# Patient Record
Sex: Female | Born: 1935 | Race: White | Hispanic: No | Marital: Married | Smoking: Former smoker
Health system: Southern US, Community
[De-identification: ages and names within clinical notes are randomized; demographics above are authoritative.]

## PROBLEM LIST (undated history)

## (undated) DIAGNOSIS — J449 Chronic obstructive pulmonary disease, unspecified: Secondary | ICD-10-CM

## (undated) HISTORY — PX: BACK SURGERY: SHX140

## (undated) HISTORY — PX: ABDOMINAL HYSTERECTOMY: SHX81

---

## 2003-12-26 ENCOUNTER — Ambulatory Visit: Payer: Self-pay | Admitting: Unknown Physician Specialty

## 2004-01-16 ENCOUNTER — Encounter: Payer: Self-pay | Admitting: Orthopedic Surgery

## 2004-01-18 ENCOUNTER — Encounter: Payer: Self-pay | Admitting: Orthopedic Surgery

## 2004-02-18 ENCOUNTER — Encounter: Payer: Self-pay | Admitting: Orthopedic Surgery

## 2004-04-17 ENCOUNTER — Ambulatory Visit: Payer: Self-pay | Admitting: Pain Medicine

## 2004-04-17 ENCOUNTER — Ambulatory Visit: Payer: Self-pay | Admitting: Gynecology

## 2004-04-22 ENCOUNTER — Ambulatory Visit: Payer: Self-pay | Admitting: Pain Medicine

## 2004-05-30 ENCOUNTER — Ambulatory Visit: Payer: Self-pay | Admitting: Pain Medicine

## 2004-06-10 ENCOUNTER — Ambulatory Visit: Payer: Self-pay | Admitting: Pain Medicine

## 2005-02-28 ENCOUNTER — Ambulatory Visit: Payer: Self-pay | Admitting: Internal Medicine

## 2005-07-10 ENCOUNTER — Ambulatory Visit: Payer: Self-pay | Admitting: Internal Medicine

## 2006-08-03 ENCOUNTER — Ambulatory Visit: Payer: Self-pay | Admitting: Internal Medicine

## 2006-10-14 ENCOUNTER — Ambulatory Visit: Payer: Self-pay | Admitting: Pain Medicine

## 2007-09-13 ENCOUNTER — Ambulatory Visit: Payer: Self-pay | Admitting: Internal Medicine

## 2008-08-05 ENCOUNTER — Ambulatory Visit: Payer: Self-pay | Admitting: Internal Medicine

## 2008-10-31 ENCOUNTER — Ambulatory Visit: Payer: Self-pay | Admitting: Internal Medicine

## 2009-10-04 ENCOUNTER — Ambulatory Visit: Payer: Self-pay | Admitting: Pain Medicine

## 2009-10-10 ENCOUNTER — Ambulatory Visit: Payer: Self-pay | Admitting: Pain Medicine

## 2009-11-01 ENCOUNTER — Ambulatory Visit: Payer: Self-pay | Admitting: Internal Medicine

## 2009-11-13 ENCOUNTER — Ambulatory Visit: Payer: Self-pay | Admitting: Pain Medicine

## 2010-07-16 ENCOUNTER — Ambulatory Visit: Payer: Self-pay | Admitting: Family Medicine

## 2010-07-23 ENCOUNTER — Ambulatory Visit: Payer: Self-pay | Admitting: Pain Medicine

## 2010-07-24 ENCOUNTER — Ambulatory Visit: Payer: Self-pay | Admitting: Pain Medicine

## 2010-08-20 ENCOUNTER — Ambulatory Visit: Payer: Self-pay | Admitting: Pain Medicine

## 2010-08-26 ENCOUNTER — Ambulatory Visit: Payer: Self-pay | Admitting: Pain Medicine

## 2010-09-23 ENCOUNTER — Ambulatory Visit: Payer: Self-pay | Admitting: Pain Medicine

## 2010-09-30 ENCOUNTER — Ambulatory Visit: Payer: Self-pay | Admitting: Pain Medicine

## 2010-11-05 ENCOUNTER — Ambulatory Visit: Payer: Self-pay | Admitting: Internal Medicine

## 2010-11-18 ENCOUNTER — Ambulatory Visit: Payer: Self-pay | Admitting: Pain Medicine

## 2010-12-08 ENCOUNTER — Emergency Department: Payer: Self-pay | Admitting: Unknown Physician Specialty

## 2011-05-24 ENCOUNTER — Inpatient Hospital Stay: Payer: Self-pay | Admitting: Internal Medicine

## 2011-05-24 LAB — CBC
HCT: 40.5 % (ref 35.0–47.0)
MCH: 32.6 pg (ref 26.0–34.0)
MCV: 97 fL (ref 80–100)
RBC: 4.16 10*6/uL (ref 3.80–5.20)
RDW: 14.6 % — ABNORMAL HIGH (ref 11.5–14.5)
WBC: 7.5 10*3/uL (ref 3.6–11.0)

## 2011-05-24 LAB — TROPONIN I: Troponin-I: <0.02 ng/mL

## 2011-05-24 LAB — BASIC METABOLIC PANEL
BUN: 12 mg/dL (ref 7–18)
Calcium, Total: 9.4 mg/dL (ref 8.5–10.1)
Chloride: 102 mmol/L (ref 98–107)
Co2: 28 mmol/L (ref 21–32)
Creatinine: 0.84 mg/dL (ref 0.60–1.30)
Glucose: 109 mg/dL — ABNORMAL HIGH (ref 65–99)
Osmolality: 278 (ref 275–301)
Potassium: 3.6 mmol/L (ref 3.5–5.1)

## 2011-05-24 LAB — CK TOTAL AND CKMB (NOT AT ARMC): CK, Total: 165 U/L (ref 21–215)

## 2011-05-26 LAB — BASIC METABOLIC PANEL
Anion Gap: 11 (ref 7–16)
Calcium, Total: 8.8 mg/dL (ref 8.5–10.1)
Co2: 27 mmol/L (ref 21–32)
EGFR (African American): 54 — ABNORMAL LOW
EGFR (Non-African Amer.): 45 — ABNORMAL LOW
Glucose: 167 mg/dL — ABNORMAL HIGH (ref 65–99)
Potassium: 3.2 mmol/L — ABNORMAL LOW (ref 3.5–5.1)
Sodium: 139 mmol/L (ref 136–145)

## 2011-05-26 LAB — CBC WITH DIFFERENTIAL/PLATELET
Eosinophil %: 0 %
Lymphocyte #: 1 10*3/uL (ref 1.0–3.6)
Lymphocyte %: 5.2 %
MCHC: 33.2 g/dL (ref 32.0–36.0)
Monocyte #: 0.6 10*3/uL (ref 0.0–0.7)
Monocyte %: 2.9 %
Neutrophil %: 91.9 %
Platelet: 263 10*3/uL (ref 150–440)
RBC: 3.86 10*6/uL (ref 3.80–5.20)
RDW: 14.6 % — ABNORMAL HIGH (ref 11.5–14.5)
WBC: 20.1 10*3/uL — ABNORMAL HIGH (ref 3.6–11.0)

## 2011-07-03 ENCOUNTER — Institutional Professional Consult (permissible substitution): Payer: Self-pay | Admitting: Pulmonary Disease

## 2011-12-25 ENCOUNTER — Ambulatory Visit: Payer: Self-pay | Admitting: Internal Medicine

## 2013-05-21 IMAGING — CR DG CHEST 1V PORT
1 series · 1 of 1 positions shown · non-contrast
Comparison: none

REASON FOR EXAM: shortness of breath  rm 1
COMMENTS:

PROCEDURE:     DXR - DXR PORTABLE CHEST SINGLE VIEW  - May 24, 2011  [DATE]
RESULT:     Comparison: None.

[portable]
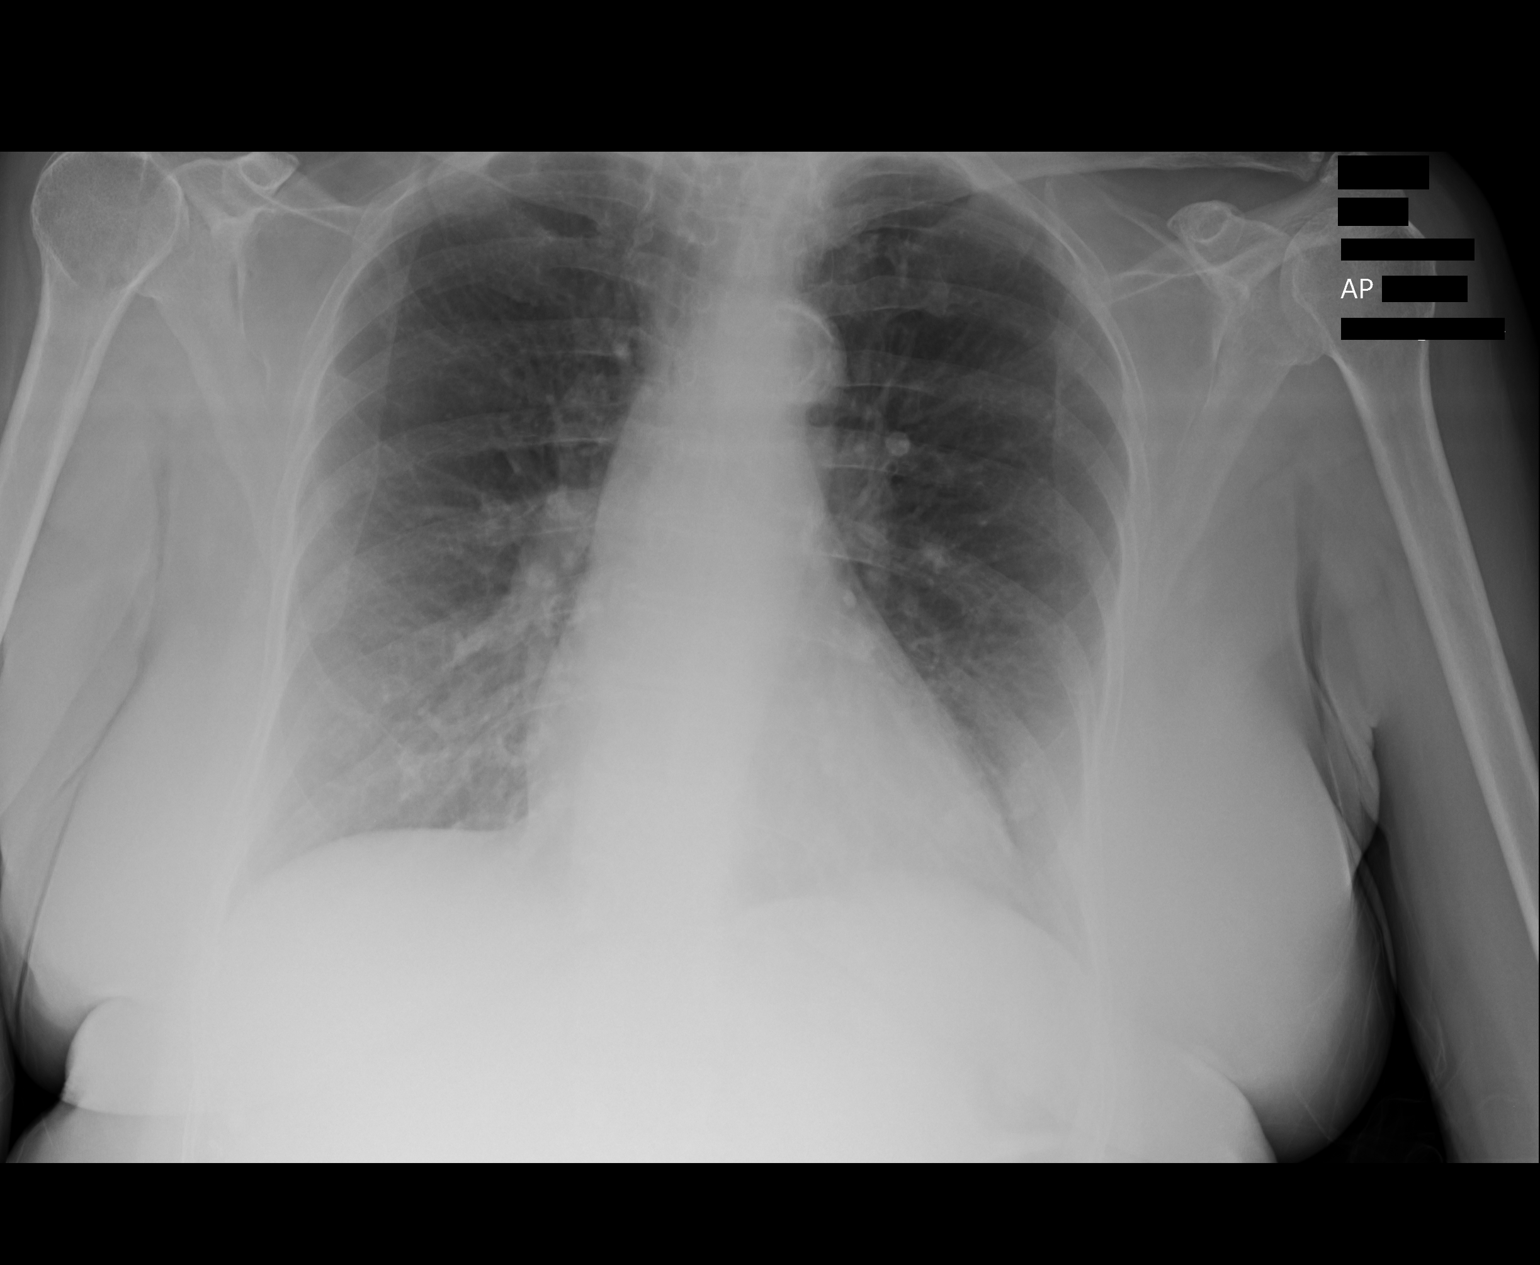

[1 of 1 positions shown; findings below may reference images not displayed]

FINDINGS: The heart and mediastinum are within normal limits. Calcifications are seen
in the aortic arch. There is mild biapical pleural parenchymal thickening.
No focal pulmonary opacities.
IMPRESSION: No acute cardiopulmonary disease.

## 2014-06-11 NOTE — Discharge Summary (Signed)
PATIENT NAME:  Chelsea Burns, Chelsea Burns MR#:  [id number] DATE OF BIRTH:  [birth year]  DATE OF ADMISSION:  05/24/2011 DATE OF DISCHARGE:  05/27/2011  DISCHARGE DIAGNOSES:  1. Acute bronchitis with reactive airway disease, improving slowly, no pneumonia.  2. Possible anxiety, started on Xanax. 3. Old T10 compression fracture with about 14% narrowing of the body. Orthopedics recommended no further intervention.   SECONDARY DIAGNOSES:  1. Chronic back pain.  2. Hypercholesterolemia.  3. Depression.  4. Osteopenia.   CONSULTANTS:  1. Deeann Saint, MD - Orthopedics. 2. Physical Therapy.   PROCEDURES/RADIOLOGY: Chest x-ray on 05/26/2011 showed no acute cardiopulmonary disease. Old vertebral compression deformity of T10 vertebra.  Chest x-ray on 05/24/2011 showed no acute cardiopulmonary disease.   HISTORY AND SHORT HOSPITAL COURSE: The patient is an 67 female with the above-mentioned medical problems who was admitted for possible acute bronchitis with reactive airway disease flare. She did not have pneumonia with chest x-ray being negative. She was started on antibiotic and steroid and was slowly improving. She was also found to have possible anxiety for which she was started on Xanax and responded well. She was found to have old chronic T10 compression fracture with about 40% narrowing of the body for which orthopedic consult was obtained with Dr. Deeann Saint who recommended no further management and possible outpatient follow-up along with nonsteroidal anti-inflammatory as needed. She was evaluated by physical therapy and was recommended home health with physical therapy, which was set up for her. On 05/27/2011, she was doing much better and was stable enough to be discharged home.  DISCHARGE PHYSICAL EXAMINATION:   VITALS: On the day of discharge, her temperature was 98.1, heart rate 82 per minute, respirations 18 per minute, blood pressure 119/57 mmHg, and she was saturating 94% on room  air.  CARDIOVASCULAR: S1 and S2 normal. No murmurs, rubs, or gallops.   LUNGS: Clear to auscultation bilaterally. No wheezing, rales, rhonchi, or crepitation.   ABDOMEN: Soft and benign.   NEUROLOGIC: Nonfocal examination. All other physical examination remained at baseline.   DISCHARGE MEDICATIONS:  1. Simvastatin 40 mg p.o. daily.  2. Aspirin 81 mg p.o. daily.  3. Alendronate 70 mg p.o. once a week.  4. Ambien 5 mg p.o. at bedtime as needed. 5. Hydrochlorothiazide/losartan 12.5/100 mg one tablet p.o. daily.  6. Citalopram 20 mg p.o. daily.  7. Amlodipine 10 mg p.o. daily. 8. Voltaren topical 1% topical gel apply twice a day.  9. Prednisone 60 mg p.o. daily, taper by 10 mg daily until done. 10. Levaquin 750 mg p.o. daily for three days. 11. Tussionex 5 mL p.o. twice a day as needed for seven days. 12. Xanax 0.25 mg p.o. three times daily as needed. 13. Advil 400 mg p.o. every eight hours as needed.Marland Kitchen   DISCHARGE DIET: Low sodium.   DISCHARGE ACTIVITY: As tolerated.   DISCHARGE INSTRUCTIONS AND FOLLOW-UP: The patient was instructed to follow-up with her primary care physician, Dr. Barry Brunner, in 1 to 2 weeks. She will need follow-up with Dr. Kendrick Fries with [facility] in 2 to 3 weeks. She was set up to get home health with    physical therapy by care management. She will need follow-up with Dr. Deeann Saint from orthopedics in 2 to 4 weeks.  TOTAL TIME DISCHARGING PATIENT: 45 minutes.   ____________________________ Ellamae Sia. Sherryll Burger, MD vss:slb D: 05/27/2011 22:31:28 ET T: 05/28/2011 10:35:04 ET JOB#: [id number]  cc: Jackqulyn Mendel S. Sherryll Burger, MD, <Dictator> Jorje Guild. Beckey Downing, MD Lupita Leash, MD Dimas Aguas  Samara DeistE. Miller, MD  Patricia PesaVIPUL S Joice Nazario MD ELECTRONICALLY SIGNED 05/28/2011 17:12

## 2014-06-11 NOTE — H&P (Signed)
PATIENT NAME:  Chelsea Burns, Chelsea Burns MR#:  [id number] DATE OF BIRTH:  05/05/1927  DATE OF ADMISSION:  05/24/2011  PRIMARY CARE PHYSICIAN: Barry Brunner, MD  CHIEF COMPLAINT: Cough, increased shortness of breath and wheezing x1 week.   HISTORY OF PRESENT ILLNESS: Chelsea Burns is an 81 year old pleasant Caucasian female with past medical history of hypertension, hypercholesterolemia, and tobacco abuse. The patient was in her usual state of health until about a week ago, on Sunday, when she had flulike symptoms; however, her symptoms persisted, primarily cough with clear sputum associated later with wheezing and increased shortness of breath. She was treated as an outpatient but failed therapy including treatment with antibiotic using Zithromax. Her symptoms worsened over the last couple of days. She has more shortness of breath and more wheezing The patient presented to the emergency department with dyspnea. Her oxygen saturation was dropping to 86%. The patient was wheezing bilaterally and her picture was consistent with chronic obstructive pulmonary disease exacerbation. The patient was admitted for further evaluation and treatment.   REVIEW OF SYSTEMS: CONSTITUTIONAL: The patient reports that she felt feverish but she did not check her temperature. No chills. No fatigue. No night sweats. EYES: No blurring of vision. No double vision. ENT: No hearing impairment. No sore throat. No dysphagia. CARDIOVASCULAR: No chest pain. Admits having shortness of breath and wheezing. No peripheral edema. No syncope. RESPIRATORY: She has cough, whitish sputum, wheezing, and increased shortness of breath. GASTROINTESTINAL: No abdominal pain. No vomiting and no diarrhea. GENITOURINARY: No dysuria or frequency of urination. MUSCULOSKELETAL: No joint pain or swelling. No muscular pain or swelling other than her chronic low back pain. INTEGUMENTARY: No skin rash. No ulcers. NEUROLOGIC: No focal weakness. No seizure activity. No  headache. PSYCHIATRY: She has history of depression. No anxiety. ENDOCRINE: No polyuria or polydipsia. No heat or cold intolerance.   PAST MEDICAL HISTORY:  1. History of chronic back pain and shingles resulting in post-herpetic neuralgia.  2. No prior history of asthma or chronic obstructive pulmonary disease exacerbation.  3. History of hypercholesterolemia.  4. History of depression.  5. History of osteopenia.   PAST SURGICAL HISTORY:  1. History of back surgery about 40 years ago.  2. History of colonoscopy 30 years ago.   FAMILY HISTORY: Her father died in his mid 49s from a heart attack. Her mother died at age 35 from a heart problem, not specified.   SOCIAL HISTORY: The patient is a housewife. She is married and living with her husband. She has three sons.   ADMISSION MEDICATIONS:  1. Aspirin 81 mg a day. 2. Citalopram 20 mg a day. 3. Amlodipine 10 mg a day. 4. Losartan with hydrochlorothiazide 100/25 mg once a day. 5. Ambien 5 mg at bedtime. 6. Fosamax 70 mg once a week.  7. Simvastatin 40 mg once a day.   SOCIAL HABITS: She smokes 2 to 3 cigarettes per week. She had cut down in the past. She used to smoke 1/2 pack a day since age of 58 or 8. She drinks alcohol only socially.   ALLERGIES: Iodine.   PHYSICAL EXAMINATION:   VITAL SIGNS: Blood pressure 156/70, pulse 79 per minute, respiratory rate 24, temperature 98.2, and oxygen saturation 86% and repeat 88%.   GENERAL APPEARANCE: Elderly lady lying in bed in no acute distress.   HEAD AND NECK: No pallor. No icterus. No cyanosis.   EARS, NOSE, AND THROAT: Hearing was normal. Nasal mucosa, lips, and tongue were normal.   EYES: Normal eyes  and conjunctivae. Pupils are about 4 mm, equal and reactive to light.   NECK: Supple. Trachea at midline. No thyromegaly. No cervical lymphadenopathy. No masses.   HEART: Normal S1 and S2. No S3 or S4. Distant heart sounds. No murmur was appreciated. No carotid bruits.   LUNGS:  Slight tachypnea. Bilateral wheezing and rhonchi and prolonged expiratory phase. The patient is not using accessory muscles.   ABDOMEN: Soft without tenderness. No hepatosplenomegaly. No masses. No hernias.   SKIN: No ulcers. No subcutaneous nodules.   MUSCULOSKELETAL: No joint swelling. No clubbing.   NEUROLOGIC: Cranial nerves II through XII are intact. No focal motor deficit.   PSYCHIATRY: The patient is alert and oriented x3. Mood and affect were normal.   LABS/STUDIES: Chest x-ray showed heart size is upper normal. Increased pulmonary markings. No consolidation. No effusion.   EKG showed normal sinus rhythm at a rate of 73 per minute. Nonspecific ST abnormalities. Otherwise unremarkable EKG.  CBC showed white count of 7000, hemoglobin 13, hematocrit 40, and normal platelet count. CPK 165. Troponin less than 0.02. Glucose 109, BUN 12, creatinine 0.8, sodium 139, and potassium 3.6.   ASSESSMENT:  1. Chronic obstructive pulmonary disease exacerbation.  2. Systemic hypertension.  3. Hypercholesterolemia.  4. Tobacco abuse   PLAN: The patient will be admitted to the medical floor and started on Duo-Neb nebulization every 4 hours p.r.n. while awake. I will start IV antibiotic using Levaquin and IV Solu-Medrol at moderate dose of 60 mg every 8 hours. Oxygen supplementation with 2 liters. The patient was advised to quit smoking indefinitely. Right now she smokes just a few cigarettes per week and does not necessitate to have a nicotine patch to be offered at the time being. I will continue her home medications as listed above, but I will hold Citalopram since there is an interaction with Levaquin and her other medications. This can be resumed later upon discharge. I spoke to the patient and her husband regarding a Living Will. They confirmed that she has a Living Will. She also appointed her husband as the caregiver.  There is no copy right now available, but this can be provided later.   TIME  NEEDED TO EVALUATE PATIENT: More than 55 minutes.  ____________________________ Carney CornersAmir M. Rudene Rearwish, MD amd:slb D: 05/24/2011 07:15:36 ET T: 05/24/2011 09:39:37 ET JOB#: [id number]  cc: Carney CornersAmir M. Rudene Rearwish, MD, <Dictator> Jorje GuildGlenn R. Beckey DowningWillett, MD Zollie ScaleAMIR M Jadore Mcguffin MD ELECTRONICALLY SIGNED 05/25/2011 6:54

## 2014-06-11 NOTE — Consult Note (Signed)
Brief Consult Note: Diagnosis: Old T-10 compression fracture.   Patient was seen by consultant.   Comments: 81 year old female admitted 2 days ago for breathing problems was found on chest X-rays yesterday to have a T-10 compression fracture.  She has had chronic pain but no recent increase and no history of injury.  Had lumbar surgery many years ago. Feeling better overall following treatment.   Exam:  Alert, oriented x3.  Head and neck move well without pain.  Minimal tenderness to percussion lower thoracic region.  Some increase in kyphosis.  Low back non tender. straight leg raise negative.  circulation/sensation/motor function normal.    X-rays:  old compression fracture T-10 with about 40% narrowing of body  Rx: No active treatment needed at this time.  return to  clinic for any problems.  Advil prn.  Electronic Signatures: Valinda HoarMiller, Mace Weinberg E (MD)  (Signed 09-Apr-13 13:43)  Authored: Brief Consult Note   Last Updated: 09-Apr-13 13:43 by Valinda HoarMiller, Refoel Palladino E (MD)

## 2015-06-15 ENCOUNTER — Other Ambulatory Visit: Payer: Self-pay | Admitting: Family Medicine

## 2015-06-15 DIAGNOSIS — R1084 Generalized abdominal pain: Secondary | ICD-10-CM

## 2015-06-20 ENCOUNTER — Ambulatory Visit: Admission: RE | Admit: 2015-06-20 | Payer: Medicare Other | Source: Ambulatory Visit

## 2015-06-26 ENCOUNTER — Ambulatory Visit
Admission: RE | Admit: 2015-06-26 | Discharge: 2015-06-26 | Disposition: A | Discharge status: Home / Self Care | Payer: Medicare Other | Source: Ambulatory Visit | Attending: Family Medicine | Admitting: Family Medicine

## 2015-06-26 ENCOUNTER — Other Ambulatory Visit
Admission: RE | Admit: 2015-06-26 | Discharge: 2015-06-26 | Disposition: A | Discharge status: Home / Self Care | Payer: Medicare Other | Source: Ambulatory Visit | Attending: Family Medicine | Admitting: Family Medicine

## 2015-06-26 ENCOUNTER — Other Ambulatory Visit: Payer: Self-pay | Admitting: Family Medicine

## 2015-06-26 DIAGNOSIS — Z01812 Encounter for preprocedural laboratory examination: Secondary | ICD-10-CM | POA: Insufficient documentation

## 2015-06-26 DIAGNOSIS — R1084 Generalized abdominal pain: Secondary | ICD-10-CM

## 2015-06-26 DIAGNOSIS — N281 Cyst of kidney, acquired: Secondary | ICD-10-CM | POA: Insufficient documentation

## 2015-06-26 DIAGNOSIS — I2510 Atherosclerotic heart disease of native coronary artery without angina pectoris: Secondary | ICD-10-CM | POA: Diagnosis not present

## 2015-06-26 LAB — CREATININE, SERUM
Creatinine, Ser: 1.03 mg/dL — ABNORMAL HIGH (ref 0.44–1.00)
GFR calc Af Amer: 55 mL/min — ABNORMAL LOW (ref >60)
GFR calc non Af Amer: 48 mL/min — ABNORMAL LOW (ref >60)

## 2016-02-12 ENCOUNTER — Emergency Department: Payer: Medicare Other

## 2016-02-12 ENCOUNTER — Encounter: Payer: Self-pay | Admitting: Emergency Medicine

## 2016-02-12 ENCOUNTER — Emergency Department
Admission: EM | Admit: 2016-02-12 | Discharge: 2016-02-12 | Disposition: A | Discharge status: Home / Self Care | Payer: Medicare Other | Attending: Emergency Medicine | Admitting: Emergency Medicine

## 2016-02-12 DIAGNOSIS — R1013 Epigastric pain: Secondary | ICD-10-CM | POA: Diagnosis present

## 2016-02-12 DIAGNOSIS — Z7982 Long term (current) use of aspirin: Secondary | ICD-10-CM | POA: Insufficient documentation

## 2016-02-12 DIAGNOSIS — N390 Urinary tract infection, site not specified: Secondary | ICD-10-CM | POA: Diagnosis not present

## 2016-02-12 DIAGNOSIS — Z79899 Other long term (current) drug therapy: Secondary | ICD-10-CM | POA: Insufficient documentation

## 2016-02-12 DIAGNOSIS — Z87891 Personal history of nicotine dependence: Secondary | ICD-10-CM | POA: Insufficient documentation

## 2016-02-12 DIAGNOSIS — E860 Dehydration: Secondary | ICD-10-CM | POA: Insufficient documentation

## 2016-02-12 DIAGNOSIS — J449 Chronic obstructive pulmonary disease, unspecified: Secondary | ICD-10-CM | POA: Insufficient documentation

## 2016-02-12 HISTORY — DX: Chronic obstructive pulmonary disease, unspecified: J44.9

## 2016-02-12 LAB — COMPREHENSIVE METABOLIC PANEL
ALT: 16 U/L (ref 14–54)
ANION GAP: 9 (ref 5–15)
AST: 24 U/L (ref 15–41)
Albumin: 2.8 g/dL — ABNORMAL LOW (ref 3.5–5.0)
Alkaline Phosphatase: 87 U/L (ref 38–126)
BILIRUBIN TOTAL: 0.6 mg/dL (ref 0.3–1.2)
BUN: 44 mg/dL — AB (ref 6–20)
CO2: 23 mmol/L (ref 22–32)
Calcium: 8.9 mg/dL (ref 8.9–10.3)
Chloride: 104 mmol/L (ref 101–111)
Creatinine, Ser: 1.61 mg/dL — ABNORMAL HIGH (ref 0.44–1.00)
GFR, EST AFRICAN AMERICAN: 32 mL/min — AB (ref >60)
GFR, EST NON AFRICAN AMERICAN: 28 mL/min — AB (ref >60)
Glucose, Bld: 110 mg/dL — ABNORMAL HIGH (ref 65–99)
POTASSIUM: 4 mmol/L (ref 3.5–5.1)
Sodium: 136 mmol/L (ref 135–145)
TOTAL PROTEIN: 6.8 g/dL (ref 6.5–8.1)

## 2016-02-12 LAB — CBC WITH DIFFERENTIAL/PLATELET
BASOS ABS: 0.1 10*3/uL (ref 0–0.1)
Basophils Relative: 1 %
EOS PCT: 1 %
Eosinophils Absolute: 0.2 10*3/uL (ref 0–0.7)
HCT: 37.7 % (ref 35.0–47.0)
Hemoglobin: 12.8 g/dL (ref 12.0–16.0)
LYMPHS PCT: 15 %
Lymphs Abs: 2.3 10*3/uL (ref 1.0–3.6)
MCH: 32.6 pg (ref 26.0–34.0)
MCHC: 33.9 g/dL (ref 32.0–36.0)
MCV: 96.1 fL (ref 80.0–100.0)
Monocytes Absolute: 1 10*3/uL — ABNORMAL HIGH (ref 0.2–0.9)
Monocytes Relative: 6 %
Neutro Abs: 12.1 10*3/uL — ABNORMAL HIGH (ref 1.4–6.5)
Neutrophils Relative %: 77 %
PLATELETS: 516 10*3/uL — AB (ref 150–440)
RBC: 3.92 MIL/uL (ref 3.80–5.20)
RDW: 14.7 % — ABNORMAL HIGH (ref 11.5–14.5)
WBC: 15.7 10*3/uL — AB (ref 3.6–11.0)

## 2016-02-12 LAB — URINALYSIS, COMPLETE (UACMP) WITH MICROSCOPIC
Bilirubin Urine: NEGATIVE
GLUCOSE, UA: NEGATIVE mg/dL
Ketones, ur: NEGATIVE mg/dL
NITRITE: NEGATIVE
PH: 5 (ref 5.0–8.0)
Protein, ur: NEGATIVE mg/dL
SPECIFIC GRAVITY, URINE: 1.012 (ref 1.005–1.030)

## 2016-02-12 LAB — LIPASE, BLOOD: LIPASE: 19 U/L (ref 11–51)

## 2016-02-12 MED ORDER — NITROFURANTOIN MACROCRYSTAL 100 MG PO CAPS: 100.0000 mg | ORAL_CAPSULE | Freq: Four times a day (QID) | ORAL | 0 refills | Status: AC

## 2016-02-12 MED ORDER — SODIUM CHLORIDE 0.9 % IV BOLUS (SEPSIS)
500.0000 mL | Freq: Once | INTRAVENOUS | Status: AC
  Administered 2016-02-12: 500 mL via INTRAVENOUS

## 2016-02-12 MED ORDER — CEFTRIAXONE SODIUM-DEXTROSE 1-3.74 GM-% IV SOLR
1.0000 g | Freq: Once | INTRAVENOUS | Status: AC
  Administered 2016-02-12: 1 g via INTRAVENOUS
  Filled 2016-02-12: qty 50

## 2016-02-12 MED ORDER — ONDANSETRON HCL 4 MG PO TABS: 4.0000 mg | ORAL_TABLET | Freq: Three times a day (TID) | ORAL | 0 refills | Status: DC | PRN

## 2016-02-12 MED ORDER — DEXTROSE 5 % IV SOLN: 1.0000 g | Freq: Once | INTRAVENOUS | Status: DC

## 2016-02-12 NOTE — Discharge Instructions (Signed)
Urine culture is still pending at this time. He should be contacted concerning results if the antibiotics do not coworker your particular urinary tract infection.  Please drink plenty of fluids and advance her diet as tolerated. He should return the emergency department especially for any fever, uncontrolled vomiting, or any other new concerns.

## 2016-02-12 NOTE — ED Provider Notes (Signed)
Time Seen: Approximately 1151  I have reviewed the triage notes  Chief Complaint: Weakness; Anorexia; and Cough   History of Present Illness: Chelsea Burns is a 82 y.o. female *who describes a decreased appetite and generalized weakness now for the last 3 or more weeks. She is recently developed some loose watery stool and the nausea. She describes some epigastric abdominal pain. She was seen by her primary physician and given medication for a "" cough "". The patient received 3 prescriptions and the cough is seemingly improved at this point. Patient's had no melena or hematochezia or high fever.   Past Medical History:  Diagnosis Date  . COPD (chronic obstructive pulmonary disease) (HCC)     There are no active problems to display for this patient.   Past Surgical History:  Procedure Laterality Date  . ABDOMINAL HYSTERECTOMY    . BACK SURGERY      Past Surgical History:  Procedure Laterality Date  . ABDOMINAL HYSTERECTOMY    . BACK SURGERY        Allergies:  Iodine  Family History: No family history on file.  Social History: Social History  Substance Use Topics  . Smoking status: Former Games developermoker  . Smokeless tobacco: Never Used  . Alcohol use Yes     Review of Systems:   10 point review of systems was performed and was otherwise negative:  Constitutional: No fever Eyes: No visual disturbances ENT: No sore throat, ear pain Cardiac: No chest pain Respiratory: No shortness of breath, wheezing, or stridor Abdomen: Patient points to the epigastric area. She's had some nausea and decreased appetite without vomiting or diarrhea. Endocrine: No weight loss, No night sweats Extremities: No peripheral edema, cyanosis Skin: No rashes, easy bruising Neurologic: No focal weakness, trouble with speech or swollowing Urologic: No dysuria, Hematuria, or urinary frequency   Physical Exam:  ED Triage Vitals  Enc Vitals Group     BP 02/12/16 1139 115/60     Pulse  Rate 02/12/16 1139 (!) 57     Resp 02/12/16 1139 16     Temp 02/12/16 1139 98.3 F (36.8 C)     Temp Source 02/12/16 1139 Oral     SpO2 02/12/16 1139 96 %     Weight 02/12/16 1141 145 lb (65.8 kg)     Height 02/12/16 1141 5\' 1"  (1.549 m)     Head Circumference --      Peak Flow --      Pain Score --      Pain Loc --      Pain Edu? --      Excl. in GC? --     General: Awake , Alert , and Oriented times 3; GCS 15 Head: Normal cephalic , atraumatic Eyes: Pupils equal , round, reactive to light Nose/Throat: No nasal drainage, patent upper airway without erythema or exudate.  Neck: Supple, Full range of motion, No anterior adenopathy or palpable thyroid masses Lungs: Clear to ascultation without wheezes , rhonchi, or rales Heart: Regular rate, regular rhythm without murmurs , gallops , or rubs Abdomen: Soft, non tender without rebound, guarding , or rigidity; bowel sounds positive and symmetric in all 4 quadrants. No organomegaly .        Extremities: 2 plus symmetric pulses. No edema, clubbing or cyanosis Neurologic: normal ambulation, Motor symmetric without deficits, sensory intact Skin: warm, dry, no rashes   Labs:   All laboratory work was reviewed including any pertinent negatives or positives listed below:  Labs Reviewed  COMPREHENSIVE METABOLIC PANEL - Abnormal; Notable for the following:       Result Value   Glucose, Bld 110 (*)    BUN 44 (*)    Creatinine, Ser 1.61 (*)    Albumin 2.8 (*)    GFR calc non Af Amer 28 (*)    GFR calc Af Amer 32 (*)    All other components within normal limits  CBC WITH DIFFERENTIAL/PLATELET - Abnormal; Notable for the following:    WBC 15.7 (*)    RDW 14.7 (*)    Platelets 516 (*)    Neutro Abs 12.1 (*)    Monocytes Absolute 1.0 (*)    All other components within normal limits  LIPASE, BLOOD  URINALYSIS, COMPLETE (UACMP) WITH MICROSCOPIC   Patient has some elevated white blood cell count and some findings of dehydration with an  elevated BUN and creatinine. Urine culture was added   EKG:  ED ECG REPORT I, Jennye MoccasinBrian S Quigley, the attending physician, personally viewed and interpreted this ECG.  Date: 02/12/2016 EKG Time: 1138 Rate: 60 Rhythm: normal sinus rhythm QRS Axis: normal Intervals: normal ST/T Wave abnormalities: normal Conduction Disturbances: none Narrative Interpretation: unremarkable No acute ischemic changes   Radiology:  "Dg Chest 2 View  Result Date: 02/12/2016 CLINICAL DATA:  COPD EXAM: CHEST  2 VIEW COMPARISON:  05/26/2011 FINDINGS: Cardiomediastinal silhouette is stable. No infiltrate or pleural effusion. No pulmonary edema. Stable moderate compression deformity probable T10 vertebral body. Atherosclerotic calcifications of thoracic aorta. IMPRESSION: No active cardiopulmonary disease. Stable moderate compression deformity of T10 vertebral body. Electronically Signed   By: Natasha MeadLiviu  Pop M.D.   On: 02/12/2016 12:31   [place] Abdomen Limited Ruq  Result Date: 02/12/2016 CLINICAL DATA:  Epigastric pain for several weeks EXAM: US ABDOMEN LIMITED - RIGHT UPPER QUADRANT COMPARISON:  CT abdomen and pelvis of 06/26/2015 FINDINGS: Gallbladder: The gallbladder is visualized and no gallstones are noted. The gallbladder wall is minimally prominent. No pain is present over the gallbladder with compression. Common bile duct: Diameter: The common bile duct is prominent measuring 9.3 mm in diameter. On prior CT the common bile duct measured approximately 7.5 mm. Correlation with liver function tests is recommended. Liver: The liver has a normal parenchymal pattern. No focal hepatic abnormality is seen. Incidental right renal cyst is noted of 10.4 cm in diameter, unchanged compared to the prior CT images. IMPRESSION: 1. No gallstones. 2. Slight prominence of the common bile duct of questionable significance. Correlate clinically. 3. No hepatic abnormality Electronically Signed   By: Dwyane DeePaul  Barry M.D.   On: 02/12/2016 13:13   "  I personally reviewed the radiologic studies     ED Course:  I felt was unlikely the patient had a surgical abdomen such as acute cholecystitis, acute appendicitis or diverticulitis at this time. She does appear to have urinary tract infection I felt we should go ahead and address that with IV antibiotics here with IV Rocephin and she'll be discharged on oral antibiotics. He has some improved as far as being able to tolerate food and fluid here in emergency department. He is given IV 1 L normal saline bolus and I felt her dehydration with corrective she was able to advance her diet and drink fluids. Clinical Course      Assessment:  Acute epigastric abdominal pain Urinary tract infection Dehydration   Final Clinical Impression:   Final diagnoses:  Epigastric pain     Plan: * Outpatient " New Prescriptions  NITROFURANTOIN (MACRODANTIN) 100 MG CAPSULE    Take 1 capsule (100 mg total) by mouth 4 (four) times daily.   ONDANSETRON (ZOFRAN) 4 MG TABLET    Take 1 tablet (4 mg total) by mouth every 8 (eight) hours as needed for nausea or vomiting.  " Patient was advised to return immediately if condition worsens. Patient was advised to follow up with their primary care physician or other specialized physicians involved in their outpatient care. The patient and/or family member/power of attorney had laboratory results reviewed at the bedside. All questions and concerns were addressed and appropriate discharge instructions were distributed by the nursing staff.             Jennye Moccasin, MD 02/12/16 1540

## 2016-02-12 NOTE — ED Triage Notes (Signed)
Weakness and loss of appetitie for about 2 weeks.  Says no cough, but she does cough occasionally now.  Pt in nad.  No falls. Per ems vss and fsbs was 130.

## 2016-02-12 NOTE — ED Notes (Signed)
Patient given water and graham crackers per Dr. Huel CoteQuigley.

## 2016-02-14 LAB — URINE CULTURE

## 2016-02-15 NOTE — Progress Notes (Signed)
ED Culture Results   Allergies: iodine  Visit Date: 02/12/16 Chief Complaint: weakness, anorexia, cough Culture Type: Urine  Culture Results: E.coli Original Abx given: Nitrofurantoin  Original Abx sensitive, intermediate, or resistant: sensitive Recommended Abx: Cephalexin 250mg  BID x 5days  ED Physician: Dr. Fuller SongQuigley Contacted Patient: Yes, patient seems to be doing ok on nitrofurantoin at this time. Patients SCR could have been elevated this past admission as SCr was at 1.03 in May 2017. MD asked me to contact patient to see if they are responding to antibiotic. Per MD if patient not responding then call in Cephalexin. Patient was informed to call back on Monday if worsening.    Delsa BernKelly m Fuhrmann, PharmD 1:39 PM 02/15/2016

## 2017-03-17 ENCOUNTER — Other Ambulatory Visit: Payer: Self-pay

## 2017-03-17 ENCOUNTER — Ambulatory Visit
Admission: EM | Admit: 2017-03-17 | Discharge: 2017-03-17 | Disposition: A | Discharge status: Home / Self Care | Payer: Medicare Other | Attending: Family Medicine | Admitting: Family Medicine

## 2017-03-17 ENCOUNTER — Encounter: Payer: Self-pay | Admitting: Emergency Medicine

## 2017-03-17 DIAGNOSIS — R350 Frequency of micturition: Secondary | ICD-10-CM

## 2017-03-17 DIAGNOSIS — R300 Dysuria: Secondary | ICD-10-CM

## 2017-03-17 DIAGNOSIS — N3000 Acute cystitis without hematuria: Secondary | ICD-10-CM | POA: Diagnosis not present

## 2017-03-17 LAB — URINALYSIS, COMPLETE (UACMP) WITH MICROSCOPIC

## 2017-03-17 MED ORDER — CEPHALEXIN 250 MG PO CAPS: 250.0000 mg | ORAL_CAPSULE | Freq: Two times a day (BID) | ORAL | 0 refills | Status: DC

## 2017-03-17 NOTE — ED Provider Notes (Signed)
[facility]    CSN: [id number] Arrival date & time: 03/17/17  1042  History   Chief Complaint Chief Complaint  Patient presents with  . Dysuria  . Urinary Frequency   HPI   84 year old female presents with concerns for UTI.  Patient reports that her symptoms started on Sunday.  She has had dysuria, frequency, and urgency.  Pain currently 3/10. She is had improvement in her pain with Azo.  No hematuria.  No flank pain.  No fevers or chills.  No known exacerbating factors.  No other associated symptoms.  No other complaints at this time.  PMH: Mild dementia 09/27/2015  Esophageal reflux 06/01/2013  Benign essential hypertension 06/01/2013  Hyperlipidemia, unspecified 06/01/2013  Insomnia, unspecified 06/01/2013  Osteoporosis 06/01/2013  Restless legs syndrome (RLS) 06/01/2013  Tobacco use disorder 06/01/2013  Overview:   Overview:  Smokes 2-3 cigarettes per week.    Surgical Hx: HYSTERECTOMY   Total with bilateral salpingo-oophorectomy  ENDOSCOPIC LUMBAR DISCECTOMY W/ LASER  N/A   Hernia surgery      OB History    No data available     Home Medications    Prior to Admission medications   Medication Sig Start Date End Date Taking? Authorizing Provider  amLODipine (NORVASC) 10 MG tablet Take 1 tablet by mouth daily. 01/08/16  Yes [provider]  aspirin EC 81 MG tablet Take 81 mg by mouth daily. 08/08/14  Yes [provider]  buPROPion (WELLBUTRIN XL) 150 MG 24 hr tablet Take 1 tablet by mouth daily. 12/26/15  Yes [provider]  citalopram (CELEXA) 20 MG tablet Take 1 tablet by mouth daily. 12/26/15  Yes [provider]  donepezil (ARICEPT) 10 MG tablet Take 1 tablet by mouth at bedtime. 12/27/15  Yes [provider]  losartan-hydrochlorothiazide (HYZAAR) 100-25 MG tablet Take 1 tablet by mouth daily. 01/08/16  Yes [provider]  mirtazapine (REMERON) 15 MG tablet Take 1 tablet by mouth at bedtime.  12/26/15  Yes [provider]  omeprazole (PRILOSEC) 20 MG capsule Take 1 capsule by mouth daily. 01/08/16  Yes [provider]  ondansetron (ZOFRAN) 4 MG tablet Take 1 tablet (4 mg total) by mouth every 8 (eight) hours as needed for nausea or vomiting. 02/12/16  Yes Jennye Moccasin, MD  PROAIR HFA 108 [phone] Base) MCG/ACT inhaler Inhale 2 puffs into the lungs every 6 (six) hours as needed for wheezing. 01/25/16  Yes [provider]  simvastatin (ZOCOR) 40 MG tablet Take 1 tablet by mouth at bedtime. 01/08/16  Yes [provider]  zolpidem (AMBIEN) 5 MG tablet Take 5 mg by mouth at bedtime. 08/24/15  Yes [provider]  cephALEXin (KEFLEX) 250 MG capsule Take 1 capsule (250 mg total) by mouth 2 (two) times daily. 03/17/17   Tommie Sams, DO  cetirizine (ZYRTEC) 10 MG tablet Take 10 mg by mouth daily. 11/05/15 11/04/16  [provider]   Family History Coronary Artery Disease (Blocked arteries around heart) Father    Myocardial Infarction (Heart attack) Father    Coronary Artery Disease (Blocked arteries around heart) Mother     Social History Social History   Tobacco Use  . Smoking status: Former Games developer  . Smokeless tobacco: Never Used  Substance Use Topics  . Alcohol use: Yes  . Drug use: Not on file   Allergies   Iodine   Review of Systems Review of Systems  Constitutional: Negative.   Gastrointestinal: Negative.   Genitourinary: Positive for  dysuria, frequency and urgency. Negative for flank pain and hematuria.   Physical Exam Triage Vital Signs ED Triage Vitals  Enc Vitals Group     BP 03/17/17 1119 132/80     Pulse Rate 03/17/17 1119 76     Resp 03/17/17 1119 14     Temp 03/17/17 1119 97.6 F (36.4 C)     Temp Source 03/17/17 1119 Oral     SpO2 03/17/17 1119 98 %     Weight 03/17/17 1105 130 lb (59 kg)     Height 03/17/17 1105 5' (1.524 m)     Head Circumference --      Peak Flow --      Pain Score 03/17/17 1105  3     Pain Loc --      Pain Edu? --      Excl. in GC? --    No data found.  Updated Vital Signs BP 132/80 (BP Location: Left Arm)   Pulse 76   Temp 97.6 F (36.4 C) (Oral)   Resp 14   Ht 5' (1.524 m)   Wt 130 lb (59 kg)   SpO2 98%   BMI 25.39 kg/m    Physical Exam  Constitutional: She appears well-developed and well-nourished. No distress.  HENT:  Head: Normocephalic and atraumatic.  Eyes: No scleral icterus.  Cardiovascular: Normal rate and regular rhythm.  Murmur heard. Pulmonary/Chest: Effort normal and breath sounds normal. She has no wheezes. She has no rales.  Abdominal: Soft. There is no tenderness.  No CVA tenderness.  Neurological: She is alert.  Psychiatric: She has a normal mood and affect. Her behavior is normal.  Nursing note and vitals reviewed.  UC Treatments / Results  Labs (all labs ordered are listed, but only abnormal results are displayed) Labs Reviewed  URINALYSIS, COMPLETE (UACMP) WITH MICROSCOPIC - Abnormal; Notable for the following components:      Result Value   Color, Urine ORANGE (*)    APPearance CLOUDY (*)    Glucose, UA   (*)    Value: TEST NOT REPORTED DUE TO COLOR INTERFERENCE OF URINE PIGMENT   Hgb urine dipstick   (*)    Value: TEST NOT REPORTED DUE TO COLOR INTERFERENCE OF URINE PIGMENT   Bilirubin Urine   (*)    Value: TEST NOT REPORTED DUE TO COLOR INTERFERENCE OF URINE PIGMENT   Ketones, ur   (*)    Value: TEST NOT REPORTED DUE TO COLOR INTERFERENCE OF URINE PIGMENT   Protein, ur   (*)    Value: TEST NOT REPORTED DUE TO COLOR INTERFERENCE OF URINE PIGMENT   Nitrite   (*)    Value: TEST NOT REPORTED DUE TO COLOR INTERFERENCE OF URINE PIGMENT   Leukocytes, UA   (*)    Value: TEST NOT REPORTED DUE TO COLOR INTERFERENCE OF URINE PIGMENT   Squamous Epithelial / LPF 6-30 (*)    Bacteria, UA MANY (*)    All other components within normal limits  URINE CULTURE    EKG  EKG Interpretation None       Radiology No results  found.  Procedures Procedures (including critical care time)  Medications Ordered in UC Medications - No data to display   Initial Impression / Assessment and Plan / UC Course  I have reviewed the triage vital signs and the nursing notes.  Pertinent labs & imaging results that were available during my care of the patient were reviewed by me and considered in my medical  decision making (see chart for details).     84 year old female presents with UTI.  Bacteria and pyuria noted on microscopy.  Treating with Keflex while awaiting culture.  Of note, Keflex was renally dosed based on patient's most recent creatinine.  Final Clinical Impressions(s) / UC Diagnoses   Final diagnoses:  Acute cystitis without hematuria    ED Discharge Orders        Ordered    cephALEXin (KEFLEX) 250 MG capsule  2 times daily     03/17/17 1152     Controlled Substance Prescriptions [place] Controlled Substance Registry consulted? Not Applicable   Tommie SamsCook, Adajah Cocking G, DO 03/17/17 1212

## 2017-03-17 NOTE — ED Triage Notes (Signed)
Patient c/o burning when urinating and increase In urinary frequency that started two days ago.

## 2017-03-20 LAB — URINE CULTURE: Culture: >=100000 — AB

## 2020-10-03 ENCOUNTER — Emergency Department: Payer: Medicare Other

## 2020-10-03 ENCOUNTER — Emergency Department
Admission: EM | Admit: 2020-10-03 | Discharge: 2020-10-03 | Disposition: A | Discharge status: Home / Self Care | Payer: Medicare Other | Attending: Emergency Medicine | Admitting: Emergency Medicine

## 2020-10-03 ENCOUNTER — Other Ambulatory Visit: Payer: Self-pay

## 2020-10-03 DIAGNOSIS — S22080A Wedge compression fracture of T11-T12 vertebra, initial encounter for closed fracture: Secondary | ICD-10-CM | POA: Diagnosis not present

## 2020-10-03 DIAGNOSIS — Z87891 Personal history of nicotine dependence: Secondary | ICD-10-CM | POA: Insufficient documentation

## 2020-10-03 DIAGNOSIS — Y92019 Unspecified place in single-family (private) house as the place of occurrence of the external cause: Secondary | ICD-10-CM | POA: Diagnosis not present

## 2020-10-03 DIAGNOSIS — Z79899 Other long term (current) drug therapy: Secondary | ICD-10-CM | POA: Insufficient documentation

## 2020-10-03 DIAGNOSIS — J449 Chronic obstructive pulmonary disease, unspecified: Secondary | ICD-10-CM | POA: Insufficient documentation

## 2020-10-03 DIAGNOSIS — M545 Low back pain, unspecified: Secondary | ICD-10-CM

## 2020-10-03 DIAGNOSIS — W01198A Fall on same level from slipping, tripping and stumbling with subsequent striking against other object, initial encounter: Secondary | ICD-10-CM | POA: Diagnosis not present

## 2020-10-03 MED ORDER — TRAMADOL HCL 50 MG PO TABS
50.0000 mg | ORAL_TABLET | Freq: Once | ORAL | Status: AC
  Administered 2020-10-03: 50 mg via ORAL
  Filled 2020-10-03: qty 1

## 2020-10-03 MED ORDER — TRAMADOL HCL 50 MG PO TABS: 50.0000 mg | ORAL_TABLET | Freq: Four times a day (QID) | ORAL | 0 refills | Status: AC | PRN

## 2020-10-03 NOTE — Discharge Instructions (Addendum)
Please take the Ultram or tramadol if needed for pain as prescribed.  Be careful it may make you woozy, do not fall again.  Please follow-up with Dr. Rosita Kea the orthopedic surgeon.  Give his office call and schedule appointment in a week.  Please return here for any pain weakness or numbness or any other problems.

## 2020-10-03 NOTE — ED Provider Notes (Signed)
[facility] Emergency Department Provider Note  Time seen: 1:41 PM  I have reviewed the triage vital signs and the nursing notes.   HISTORY  Chief Complaint Fall and Back Pain   HPI Chelsea Burns is a 87 y.o. female with a past medical history of COPD who presents to the emergency department for lower back pain.  According to the patient's caregiver on Monday she stumbled backwards hitting her back on the corner of a wall.  Did not hit her head.  No LOC.  However since that time the patient has been complaining of back pain.  Caregiver states this morning patient appeared to be in significant pain and the caregiver thought she heard a rattle in the patient's lungs so she brought her to the emergency department for evaluation.  Here patient's only complaint is lower back pain.  Denies any shortness of breath.  No chest or abdominal pain.  Denies any headache weakness or numbness.   Past Medical History:  Diagnosis Date   COPD (chronic obstructive pulmonary disease) (HCC)     There are no problems to display for this patient.   Past Surgical History:  Procedure Laterality Date   ABDOMINAL HYSTERECTOMY     BACK SURGERY      Prior to Admission medications   Medication Sig Start Date End Date Taking? Authorizing Provider  amLODipine (NORVASC) 10 MG tablet Take 1 tablet by mouth daily. 01/08/16   [provider]  aspirin EC 81 MG tablet Take 81 mg by mouth daily. 08/08/14   [provider]  buPROPion (WELLBUTRIN XL) 150 MG 24 hr tablet Take 1 tablet by mouth daily. 12/26/15   [provider]  cephALEXin (KEFLEX) 250 MG capsule Take 1 capsule (250 mg total) by mouth 2 (two) times daily. 03/17/17   Tommie Sams, DO  cetirizine (ZYRTEC) 10 MG tablet Take 10 mg by mouth daily. 11/05/15 11/04/16  [provider]  citalopram (CELEXA) 20 MG tablet Take 1 tablet by mouth daily. 12/26/15   [provider]  donepezil (ARICEPT) 10  MG tablet Take 1 tablet by mouth at bedtime. 12/27/15   [provider]  losartan-hydrochlorothiazide (HYZAAR) 100-25 MG tablet Take 1 tablet by mouth daily. 01/08/16   [provider]  mirtazapine (REMERON) 15 MG tablet Take 1 tablet by mouth at bedtime. 12/26/15   [provider]  omeprazole (PRILOSEC) 20 MG capsule Take 1 capsule by mouth daily. 01/08/16   [provider]  ondansetron (ZOFRAN) 4 MG tablet Take 1 tablet (4 mg total) by mouth every 8 (eight) hours as needed for nausea or vomiting. 02/12/16   Jennye Moccasin, MD  PROAIR HFA 108 [phone] Base) MCG/ACT inhaler Inhale 2 puffs into the lungs every 6 (six) hours as needed for wheezing. 01/25/16   [provider]  simvastatin (ZOCOR) 40 MG tablet Take 1 tablet by mouth at bedtime. 01/08/16   [provider]  zolpidem (AMBIEN) 5 MG tablet Take 5 mg by mouth at bedtime. 08/24/15   [provider]    Allergies  Allergen Reactions   Iodine Hives    Pt states she is allergic to Iodine with hives. Allergy occurred many years ago.     No family history on file.  Social History Social History   Tobacco Use   Smoking status: Former   Smokeless tobacco: Never  Building services engineer Use: Never used  Substance Use Topics   Alcohol use: Yes  Drug use: Not Currently    Review of Systems Constitutional: Negative for fever. Cardiovascular: Negative for chest pain. Respiratory: Negative for shortness of breath. Gastrointestinal: Negative for abdominal pain Musculoskeletal: Moderate lower back pain mostly to the left lower back. Skin: Negative for skin complaints  Neurological: Negative for headache All other ROS negative  ____________________________________________   PHYSICAL EXAM:  VITAL SIGNS: ED Triage Vitals [10/03/20 1109]  Enc Vitals Group     BP (!) 153/74     Pulse Rate 77     Resp 18     Temp 98.9 F (37.2 C)     Temp Source Oral     SpO2 93 %     Weight       Height      Head Circumference      Peak Flow      Pain Score      Pain Loc      Pain Edu?      Excl. in GC?    Constitutional: Alert.  Well appearing and in no distress.  Calm cooperative and pleasant Eyes: Normal exam ENT      Head: Normocephalic and atraumatic.      Mouth/Throat: Mucous membranes are moist. Cardiovascular: Normal rate, regular rhythm Respiratory: Normal respiratory effort without tachypnea nor retractions. Breath sounds are clear  Gastrointestinal: Soft and nontender. No distention.  Musculoskeletal: Patient has mild tenderness across the midline of the lower lumbar spine with mild left SI tenderness as well. Neurologic:  Normal speech and language. No gross focal neurologic deficits.  No lower extremity numbness. Skin:  Skin is warm, dry and intact.  Psychiatric: Mood and affect are normal.   INITIAL IMPRESSION / ASSESSMENT AND PLAN / ED COURSE  Pertinent labs & imaging results that were available during my care of the patient were reviewed by me and considered in my medical decision making (see chart for details).   Patient presents emergency department for low back pain after hitting a wall falling backwards 2 days ago.  Patient initially found have a lower O2 saturation around 91%, currently satting in the mid 90s on room air.  Clear lung sounds however we will obtain a chest x-ray as a precaution.  We will also obtain CT imaging of the lumbar spine to rule out compression fracture or other acute finding.  We will treat with 50 mg of tramadol while awaiting results.  Chelsea Burns was evaluated in Emergency Department on 10/03/2020 for the symptoms described in the history of present illness. She was evaluated in the context of the global COVID-19 pandemic, which necessitated consideration that the patient might be at risk for infection with the SARS-CoV-2 virus that causes COVID-19. Institutional protocols and algorithms that pertain to the evaluation of  patients at risk for COVID-19 are in a state of rapid change based on information released by regulatory bodies including the CDC and federal and state organizations. These policies and algorithms were followed during the patient's care in the ED.  ____________________________________________   FINAL CLINICAL IMPRESSION(S) / ED DIAGNOSES  Back pain Thresa Ross, MD 10/10/20 2005

## 2020-10-03 NOTE — ED Triage Notes (Signed)
Pt comes into the ED via EMS from home, states she had a fall on Monday, having difficulty ambulating since, 90%RA, placed on 2L [place], pt c/o left lower back pain since the fall.   Caregiver Flo [phone], states she will pick the pt when ready  157/64 95HR

## 2020-10-03 NOTE — ED Provider Notes (Signed)
Patient has a T12 compression fracture.  Discussed the patient with Dr. Rosita Kea who offered her admission for kyphoplasty but patient wants to wait for a little bit and see how she is doing.  She says she is not hurting too much at home.  We will abide by her wishes.  She has a prescription for tramadol if needed.  She can follow-up with Dr. Rosita Kea in a week.  She can always return if she is worse.   Arnaldo Natal, MD 10/03/20 [phone]

## 2022-10-01 IMAGING — CT CT L SPINE W/O CM
3 series · 14 of 33 positions shown, 17 images · non-contrast
Comparison: CT 06/26/2015

CLINICAL DATA: Lower back pain, trauma

EXAM:
CT LUMBAR SPINE WITHOUT CONTRAST
TECHNIQUE: Multidetector CT imaging of the lumbar spine was performed without
intravenous contrast administration. Multiplanar CT image
reconstructions were also generated.

[Series 4: l spine soft · axial · 0.30mm/px · z∈[-316,-142]mm · 6 of 114 slices shown, 8 images]
[im 18/114  soft-tissue]
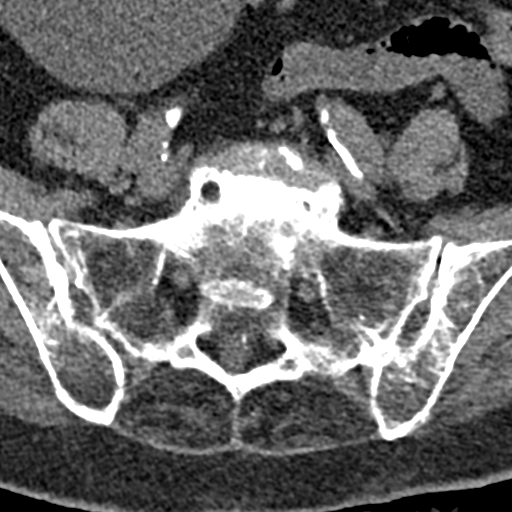
[im 18/114  bone]
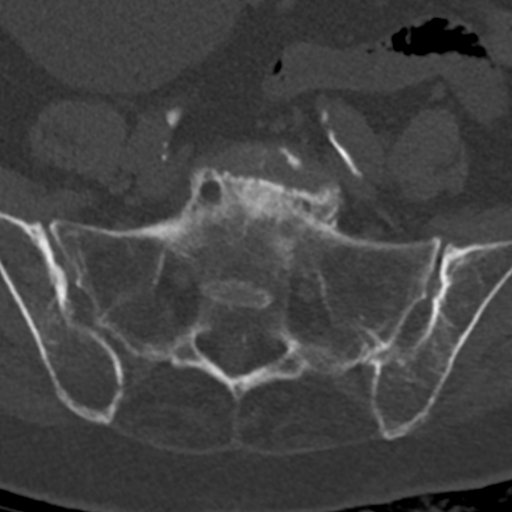
[im 35/114  bone]
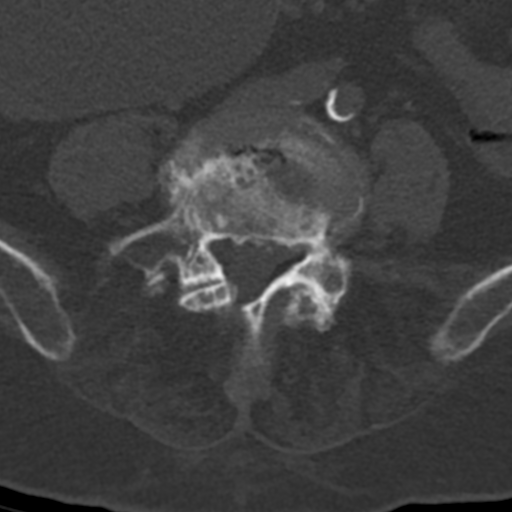
[im 53/114  bone]
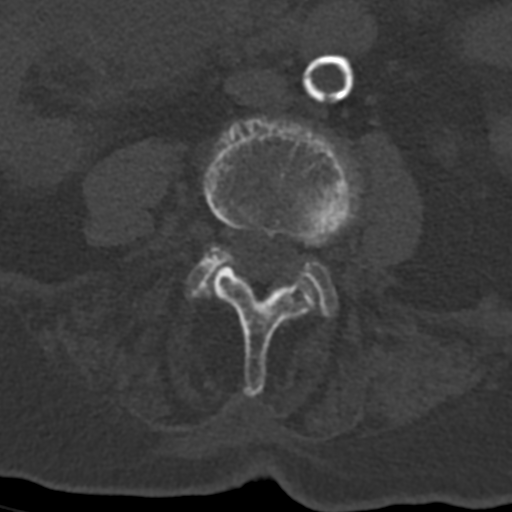
[im 70/114  bone]
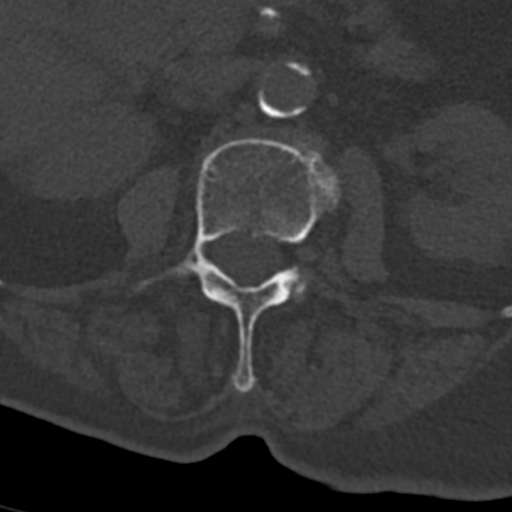
[im 87/114  soft-tissue]
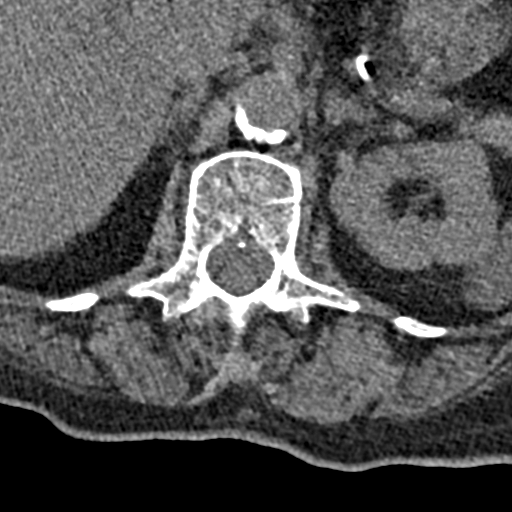
[im 87/114  bone]
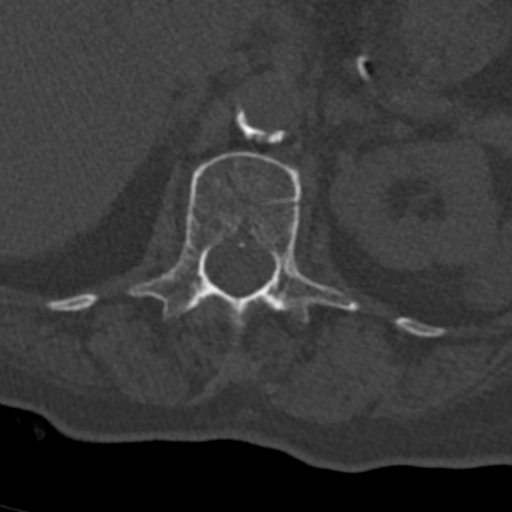
[im 105/114  bone]
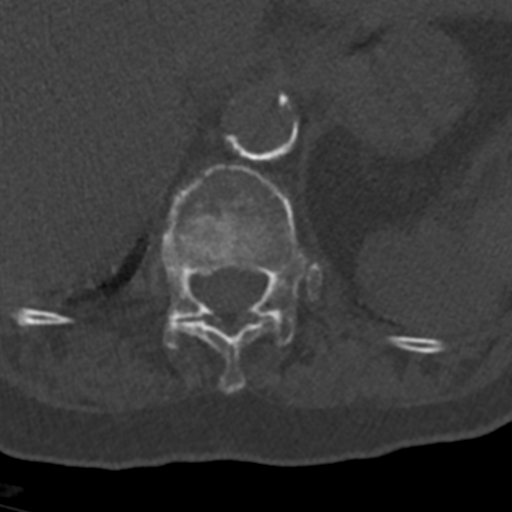

[Series 6: sagittal st · sagittal · 0.38mm/px · 5 of 81 slices shown, 6 images]
[im 27/81  bone]
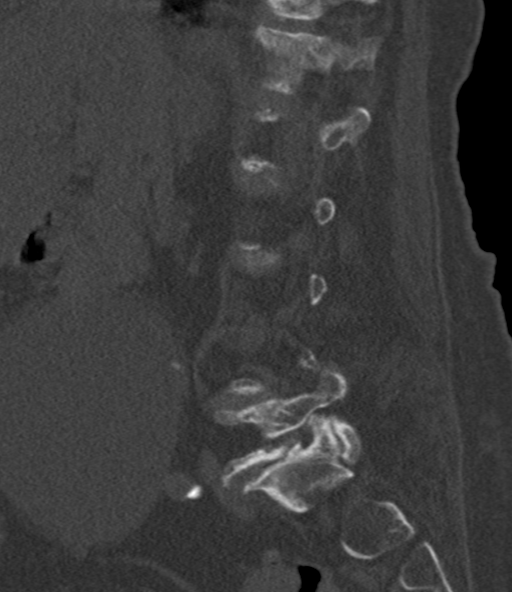
[im 34/81  bone]
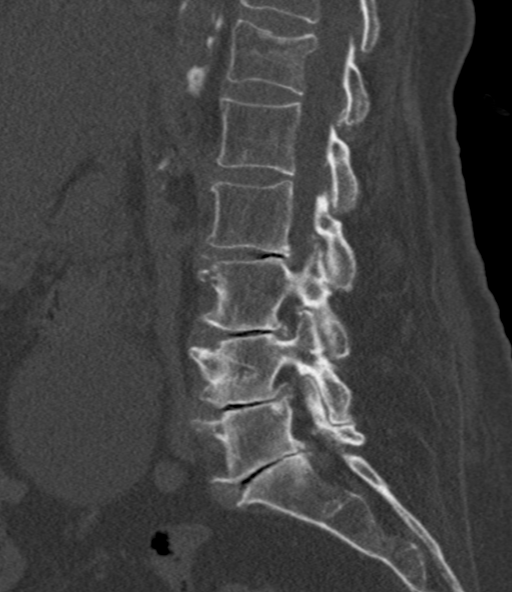
[im 41/81  soft-tissue]
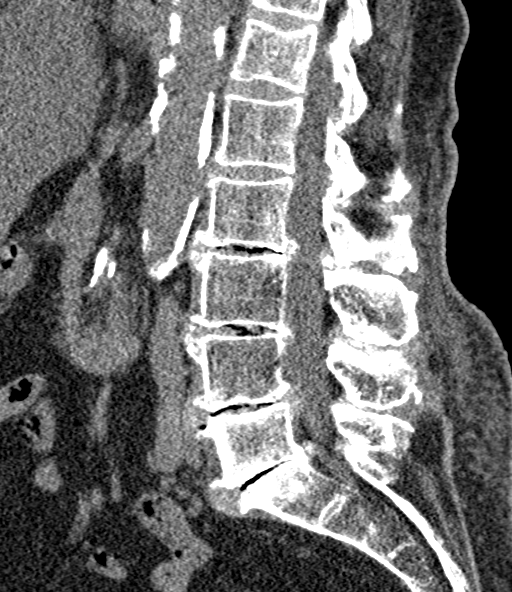
[im 41/81  bone]
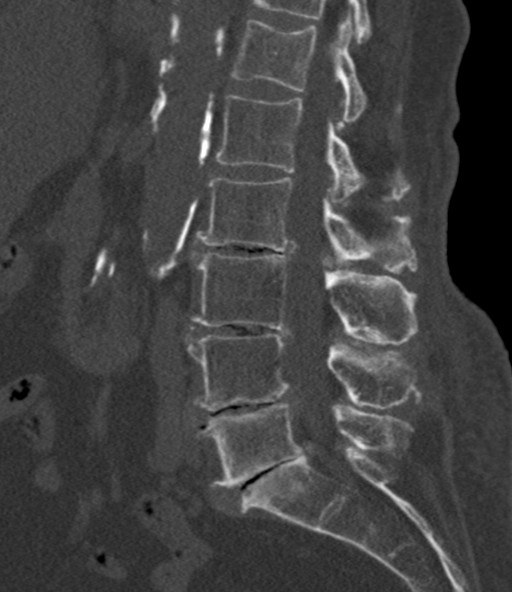
[im 47/81  bone]
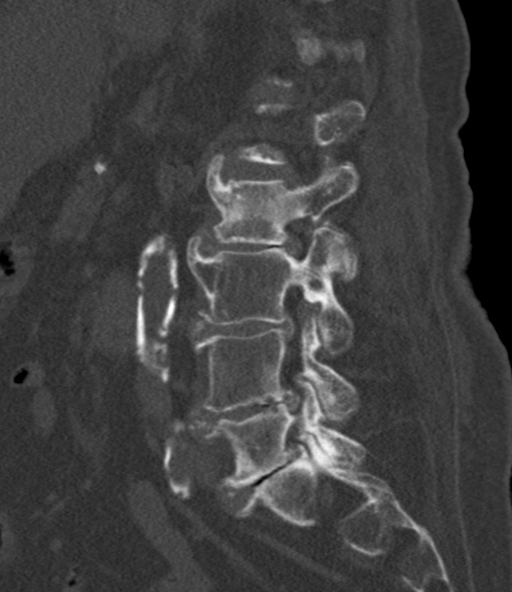
[im 54/81  bone]
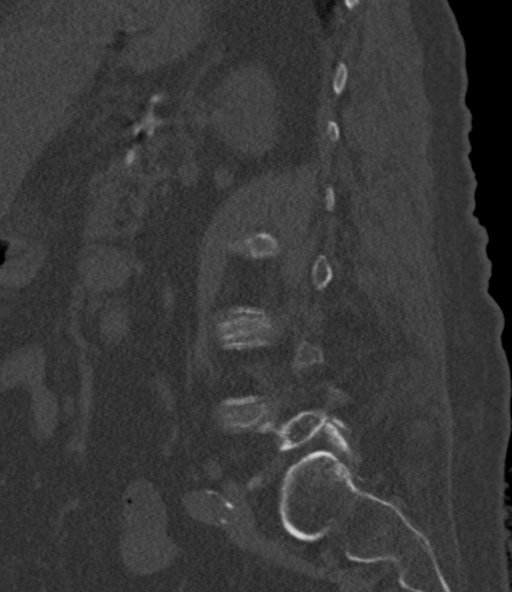

[Series 7: coronal bone · coronal · 0.31mm/px · 3 of 97 slices shown]
[im 20/97  bone]
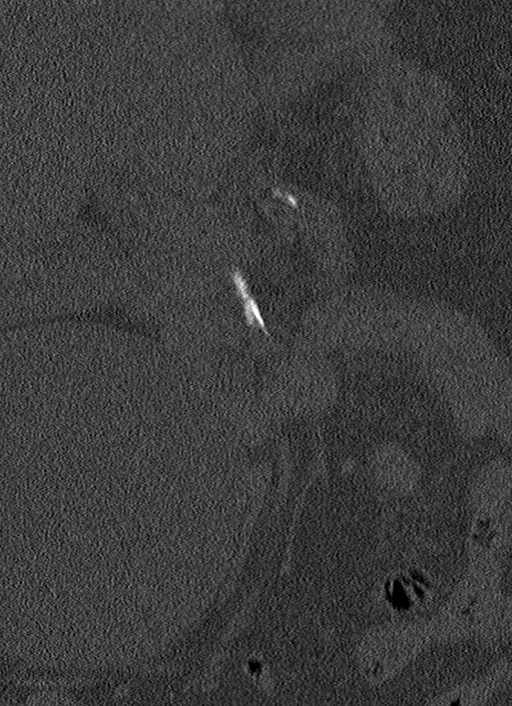
[im 39/97  bone]
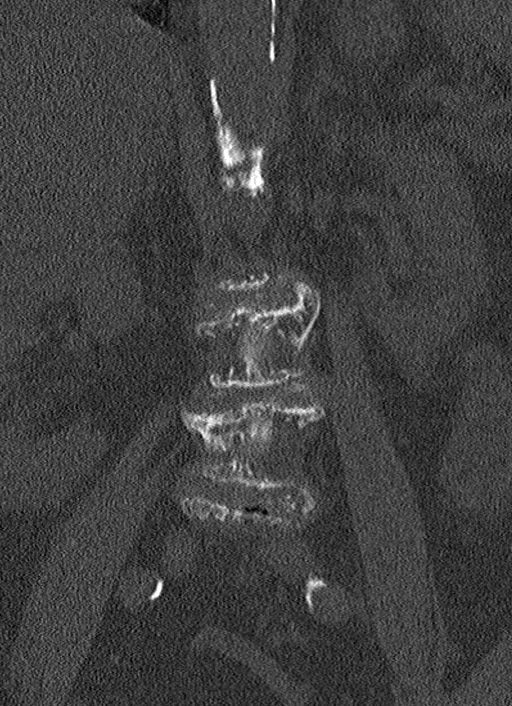
[im 58/97  bone]
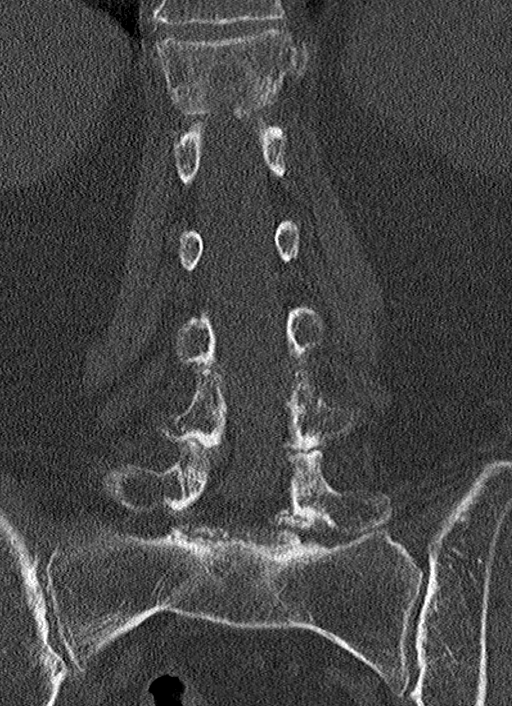

[14 of 33 positions shown; findings below may reference images not displayed]

FINDINGS: Segmentation: 5 lumbar type vertebrae.

Alignment: Normal

Vertebrae: There is a compression fracture of T12 with 20% central
height loss. No bony retropulsion. Osteopenia.

Paraspinal and other soft tissues: Large right renal cyst, partially
visualized. There is also a left renal cyst which is increased in
size since prior CT in 0021. Additionally, there is a low-density
lesion partially visualized posterior to the spleen and abutting the
upper pole the left kidney which is unchanged in size short axis in
comparison to prior exam in 0021. This is either an exophytic left
renal cyst or a lymphangioma. Aortoiliac atherosclerotic
calcifications.

Disc levels: Severe disc height loss at L3-L4, L4-L5, and L5-S1.
Moderate disc height loss at L2-L3. Prominent posterior disc
osteophyte complex ease at these levels along with bilateral facet
arthropathy resulting in varying degrees of mild to moderate spinal
canal and bilateral neural foraminal narrowing, worse on the left at
L5-S1.
IMPRESSION: T12 compression fracture with 20% central height loss. No bony
retropulsion.

Multilevel degenerative disc disease with prominent posterior disc
osteophyte complexes resulting in varying degrees of mild to
moderate spinal canal stenosis and bilateral neural foraminal
narrowing, most severe on the left at L5-S1.
